# Patient Record
Sex: Male | Born: 1975
Health system: Southern US, Community
[De-identification: ages and names within clinical notes are randomized; demographics above are authoritative.]

## PROBLEM LIST (undated history)

## (undated) DIAGNOSIS — F419 Anxiety disorder, unspecified: Secondary | ICD-10-CM

## (undated) DIAGNOSIS — J45909 Unspecified asthma, uncomplicated: Secondary | ICD-10-CM

---

## 2011-12-11 ENCOUNTER — Emergency Department (HOSPITAL_BASED_OUTPATIENT_CLINIC_OR_DEPARTMENT_OTHER)
Admission: EM | Admit: 2011-12-11 | Discharge: 2011-12-11 | Disposition: A | Discharge status: Home / Self Care | Payer: No Typology Code available for payment source | Attending: Emergency Medicine | Admitting: Emergency Medicine

## 2011-12-11 ENCOUNTER — Emergency Department (HOSPITAL_BASED_OUTPATIENT_CLINIC_OR_DEPARTMENT_OTHER): Payer: No Typology Code available for payment source

## 2011-12-11 ENCOUNTER — Encounter (HOSPITAL_BASED_OUTPATIENT_CLINIC_OR_DEPARTMENT_OTHER): Payer: Self-pay | Admitting: *Deleted

## 2011-12-11 DIAGNOSIS — M25569 Pain in unspecified knee: Secondary | ICD-10-CM | POA: Insufficient documentation

## 2011-12-11 DIAGNOSIS — R079 Chest pain, unspecified: Secondary | ICD-10-CM | POA: Insufficient documentation

## 2011-12-11 DIAGNOSIS — M25519 Pain in unspecified shoulder: Secondary | ICD-10-CM | POA: Insufficient documentation

## 2011-12-11 DIAGNOSIS — T1490XA Injury, unspecified, initial encounter: Secondary | ICD-10-CM | POA: Insufficient documentation

## 2011-12-11 HISTORY — DX: Unspecified asthma, uncomplicated: J45.909

## 2011-12-11 MED ORDER — TRAMADOL HCL 50 MG PO TABS: 50.0000 mg | ORAL_TABLET | Freq: Three times a day (TID) | ORAL | Status: DC | PRN

## 2011-12-11 MED ORDER — TRAMADOL HCL 50 MG PO TABS
50.0000 mg | ORAL_TABLET | Freq: Once | ORAL | Status: AC
  Administered 2011-12-11: 50 mg via ORAL
  Filled 2011-12-11: qty 1
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
## 2012-10-24 ENCOUNTER — Emergency Department (HOSPITAL_BASED_OUTPATIENT_CLINIC_OR_DEPARTMENT_OTHER): Payer: BC Managed Care – PPO

## 2012-10-24 ENCOUNTER — Emergency Department (HOSPITAL_BASED_OUTPATIENT_CLINIC_OR_DEPARTMENT_OTHER)
Admission: EM | Admit: 2012-10-24 | Discharge: 2012-10-24 | Disposition: A | Discharge status: Home / Self Care | Payer: BC Managed Care – PPO | Attending: Emergency Medicine | Admitting: Emergency Medicine

## 2012-10-24 ENCOUNTER — Encounter (HOSPITAL_BASED_OUTPATIENT_CLINIC_OR_DEPARTMENT_OTHER): Payer: Self-pay | Admitting: Emergency Medicine

## 2012-10-24 DIAGNOSIS — Y929 Unspecified place or not applicable: Secondary | ICD-10-CM | POA: Insufficient documentation

## 2012-10-24 DIAGNOSIS — W208XXA Other cause of strike by thrown, projected or falling object, initial encounter: Secondary | ICD-10-CM | POA: Insufficient documentation

## 2012-10-24 DIAGNOSIS — S20212A Contusion of left front wall of thorax, initial encounter: Secondary | ICD-10-CM

## 2012-10-24 DIAGNOSIS — S20219A Contusion of unspecified front wall of thorax, initial encounter: Secondary | ICD-10-CM | POA: Insufficient documentation

## 2012-10-24 DIAGNOSIS — Y9389 Activity, other specified: Secondary | ICD-10-CM | POA: Insufficient documentation

## 2012-10-24 MED ORDER — IBUPROFEN 800 MG PO TABS: 800.0000 mg | ORAL_TABLET | Freq: Three times a day (TID) | ORAL | Status: DC

## 2012-10-24 MED ORDER — HYDROCODONE-ACETAMINOPHEN 5-325 MG PO TABS: 2.0000 | ORAL_TABLET | ORAL | Status: DC | PRN

## 2012-10-24 MED ORDER — HYDROCODONE-ACETAMINOPHEN 5-325 MG PO TABS
2.0000 | ORAL_TABLET | Freq: Once | ORAL | Status: AC
  Administered 2012-10-24: 2 via ORAL
  Filled 2012-10-24: qty 2

## 2012-10-24 NOTE — ED Notes (Addendum)
Pt c/o right sided chest wall pain after dropping a dumbbell on chest x 2 days ago

## 2012-10-24 NOTE — ED Notes (Signed)
Patient transported to X-ray 

## 2012-10-24 NOTE — ED Notes (Signed)
PA at bedside.

## 2012-10-24 NOTE — ED Provider Notes (Signed)
CSN: [id number]     Arrival date & time 10/24/12  1937 History   None    Chief Complaint  Patient presents with  . Chest Injury   (Consider location/radiation/quality/duration/timing/severity/associated sxs/prior Treatment) Patient is a 37 y.o. male presenting with chest pain. The history is provided by the patient. No language interpreter was used.  Chest Pain Pain location:  L chest Pain quality: aching   Pain radiates to:  Does not radiate Pain radiates to the back: no   Pain severity:  Moderate Onset quality:  Sudden Timing:  Constant Pt dropped a 50 pd weight on his chest.   Pt complains of swelling and pain to right chest  Past Medical History  Diagnosis Date  . Asthma    History reviewed. No pertinent past surgical history. No family history on file. History  Substance Use Topics  . Smoking status: Never Smoker   . Smokeless tobacco: Not on file  . Alcohol Use: No    Review of Systems  Cardiovascular: Positive for chest pain.  All other systems reviewed and are negative.    Allergies  Review of patient's allergies indicates no known allergies.  Home Medications   Current Outpatient Rx  Name  Route  Sig  Dispense  Refill  . cyclobenzaprine (FLEXERIL) 10 MG tablet   Oral   Take 10 mg by mouth at bedtime as needed for muscle spasms.         . traMADol (ULTRAM) 50 MG tablet   Oral   Take 1 tablet (50 mg total) by mouth every 8 (eight) hours as needed for pain.   10 tablet   0    BP 125/73  Pulse 115  Temp(Src) 98.4 F (36.9 C) (Oral)  Resp 16  Ht 5\' 11"  (1.803 m)  Wt 237 lb (107.502 kg)  BMI 33.07 kg/m2  SpO2 100% Physical Exam  Nursing note and vitals reviewed. Constitutional: He appears well-developed and well-nourished.  HENT:  Head: Normocephalic and atraumatic.  Cardiovascular: Normal rate.   Pulmonary/Chest: Effort normal. He exhibits tenderness.  Tender right chest,  Hematoma right upper chest wall.    Abdominal: Soft.   Musculoskeletal: Normal range of motion.  Neurological: He is alert.  Skin: Skin is warm.  Psychiatric: He has a normal mood and affect.    ED Course  Procedures (including critical care time) Labs Review Labs Reviewed - No data to display Imaging Review Dg Chest 2 View  10/24/2012   *RADIOLOGY REPORT*  Clinical Data: Chest wall injury 2 days ago  CHEST - 2 VIEW  Comparison: 12/11/2011  Findings: Cardiomediastinal silhouette is stable.  No acute infiltrate or pulmonary edema.  No gross fractures are identified. No diagnostic pneumothorax.  IMPRESSION: No active disease.  No diagnostic pneumothorax.   Original Report Authenticated By: Natasha Mead, M.D.    MDM   1. Hematoma, chest wall, left, initial encounter    No fracture on xray.   Pt counseled on hematoma.      Lonia Skinner [place], PA-C 10/24/12 2121

## 2012-10-25 NOTE — ED Provider Notes (Signed)
Medical screening examination/treatment/procedure(s) were performed by non-physician practitioner and as supervising physician I was immediately available for consultation/collaboration.   Audree Camel, MD 10/25/12 1144

## 2012-11-07 ENCOUNTER — Ambulatory Visit: Payer: BC Managed Care – PPO | Admitting: Family Medicine

## 2016-04-03 ENCOUNTER — Emergency Department (HOSPITAL_COMMUNITY)
Admission: EM | Admit: 2016-04-03 | Discharge: 2016-04-03 | Disposition: A | Discharge status: Home / Self Care | Payer: BLUE CROSS/BLUE SHIELD | Attending: Emergency Medicine | Admitting: Emergency Medicine

## 2016-04-03 ENCOUNTER — Encounter (HOSPITAL_COMMUNITY): Payer: Self-pay | Admitting: Emergency Medicine

## 2016-04-03 ENCOUNTER — Emergency Department (HOSPITAL_COMMUNITY): Payer: BLUE CROSS/BLUE SHIELD

## 2016-04-03 DIAGNOSIS — Z79899 Other long term (current) drug therapy: Secondary | ICD-10-CM | POA: Insufficient documentation

## 2016-04-03 DIAGNOSIS — R55 Syncope and collapse: Secondary | ICD-10-CM | POA: Diagnosis present

## 2016-04-03 DIAGNOSIS — F419 Anxiety disorder, unspecified: Secondary | ICD-10-CM | POA: Insufficient documentation

## 2016-04-03 DIAGNOSIS — J45909 Unspecified asthma, uncomplicated: Secondary | ICD-10-CM | POA: Insufficient documentation

## 2016-04-03 HISTORY — DX: Anxiety disorder, unspecified: F41.9

## 2016-04-03 LAB — BASIC METABOLIC PANEL
Anion gap: 7 (ref 5–15)
BUN: 14 mg/dL (ref 6–20)
CO2: 25 mmol/L (ref 22–32)
Calcium: 9.2 mg/dL (ref 8.9–10.3)
Chloride: 106 mmol/L (ref 101–111)
Creatinine, Ser: 1.07 mg/dL (ref 0.61–1.24)
GFR calc Af Amer: >60 mL/min (ref >60)
GFR calc non Af Amer: >60 mL/min (ref >60)
Glucose, Bld: 97 mg/dL (ref 65–99)
Potassium: 3.8 mmol/L (ref 3.5–5.1)
Sodium: 138 mmol/L (ref 135–145)

## 2016-04-03 MED ORDER — SODIUM CHLORIDE 0.9 % IV BOLUS (SEPSIS): 1000.0000 mL | Freq: Once | INTRAVENOUS | Status: DC

## 2016-04-03 NOTE — ED Notes (Signed)
Pt stated he did not want to wait and get IV fluids. PA aware.

## 2016-04-03 NOTE — Discharge Instructions (Signed)
Take ibuprofen and/or Tylenol as prescribed over-the-counter as needed for your back pain. Use ice and heat alternating 20 minutes on, 20 minutes off. Attempted to the stretches and exercises attached as tolerated. Please follow-up in establish care with a primary care provider by calling the number circled on your discharge paperwork. Please make your psychiatrist aware of today's episode. Please return to emergency department if you develop any new or worsening symptoms.

## 2016-04-03 NOTE — ED Notes (Signed)
Pt drinking PO fluids.

## 2016-04-03 NOTE — ED Provider Notes (Signed)
[facility]-EMERGENCY DEPT Provider Note   CSN: [id number] Arrival date & time: 04/03/16  1901  By signing my name below, I, [name], attest that this documentation has been prepared under the direction and in the presence of non-physician practitioner, Glenford Bayley, PA-C. Electronically Signed: [name], Scribe. 04/03/2016. 7:23 PM.  History   Chief Complaint Chief Complaint  Patient presents with  . Anxiety   The history is provided by the patient. No language interpreter was used.   HPI Comments: Vincent Glass is a 41 y.o. male with a PMHx of anxiety who presents to the Emergency Department complaining a syncopal episode that occurred today. He states he has had increased stress at work recently from [employer]. He reports he was getting stressed out at work, Became anxious, left him to compose himself, and he had LOC. He cannot recall duration of episode. He reports associated increased heart rate, anxiety, and intermittent LUE numbness with anxiety. 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 He is currently on Xanex for depression. He denies any SI or HI or AVH.  Past Medical History:  Diagnosis Date  . Anxiety   . Asthma     There are no active problems to display for this patient.   History reviewed. No pertinent surgical history.     Home Medications    Prior to Admission medications   Medication Sig Start Date End Date Taking? Authorizing Provider  cyclobenzaprine (FLEXERIL) 10 MG tablet Take 10 mg by mouth at bedtime as needed for muscle spasms.    Historical Provider, MD  HYDROcodone-acetaminophen (NORCO/VICODIN) 5-325 MG per tablet Take 2 tablets by mouth every 4 (four) hours as needed. 10/24/12   Elson Areas, PA-C  ibuprofen (ADVIL,MOTRIN) 800 MG tablet Take 1 tablet (800 mg total) by mouth 3 (three) times daily. 10/24/12   Elson Areas, PA-C  traMADol (ULTRAM) 50 MG tablet Take 1 tablet (50  mg total) by mouth every 8 (eight) hours as needed for pain. 12/11/11   Gerhard Munch, MD    Family History No family history on file.  Social History Social History  Substance Use Topics  . Smoking status: Never Smoker  . Smokeless tobacco: Not on file  . Alcohol use No     Allergies   Patient has no known allergies.   Review of Systems Review of Systems  Constitutional: Negative for chills and fever.  HENT: Negative for facial swelling and sore throat.   Respiratory: Negative for shortness of breath.   Cardiovascular: Positive for palpitations. Negative for chest pain.  Gastrointestinal: Negative for abdominal pain, nausea and vomiting.  Genitourinary: Negative for dysuria.  Musculoskeletal: Positive for back pain.  Skin: Negative for rash and wound.  Neurological: Positive for syncope and numbness (LUE). Negative for headaches.  Psychiatric/Behavioral: Negative for suicidal ideas. The patient is nervous/anxious.      Physical Exam Updated Vital Signs BP 125/85 (BP Location: Left Arm)   Pulse 91   Temp 98.7 F (37.1 C) (Oral)   Resp 18   SpO2 95%   Physical Exam  Constitutional: He appears well-developed and well-nourished. No distress.  HENT:  Head: Normocephalic and atraumatic.  Mouth/Throat: Oropharynx is clear and moist. No oropharyngeal exudate.  Eyes: Conjunctivae are normal. Pupils are equal, round, and reactive to light. Right eye exhibits no discharge. Left eye exhibits no discharge. No scleral icterus.  Neck: Normal range of motion. Neck supple.  No thyromegaly present.  Cardiovascular: Normal rate, regular rhythm, normal heart sounds and intact distal pulses.  Exam reveals no gallop and no friction rub.   No murmur heard. Pulmonary/Chest: Effort normal and breath sounds normal. No stridor. No respiratory distress. He has no wheezes. He has no rales.  Abdominal: Soft. Bowel sounds are normal. He exhibits no distension. There is no tenderness. There is  no rebound and no guarding.  Musculoskeletal: He exhibits no edema.       Back:  Midline thoracic and lumbar TTP. Chest: Palpable most likely xiphoid process.   Lymphadenopathy:    He has no cervical adenopathy.  Neurological: He is alert. Coordination normal.  CN 3-12 intact; normal sensation throughout; 5/5 strength in all 4 extremities; equal bilateral grip strength  Skin: Skin is warm and dry. No rash noted. He is not diaphoretic. No pallor.  Psychiatric: He has a normal mood and affect.  Nursing note and vitals reviewed.    ED Treatments / Results  DIAGNOSTIC STUDIES: Oxygen Saturation is 95% on RA, normal by my interpretation.    COORDINATION OF CARE: 7:27 PM- Pt advised of plan for treatment and pt agrees.  Labs (all labs ordered are listed, but only abnormal results are displayed) Labs Reviewed  BASIC METABOLIC PANEL    EKG  EKG Interpretation  Date/Time:  Saturday April 03 2016 19:59:31 EST Ventricular Rate:  93 PR Interval:    QRS Duration: 76 QT Interval:  326 QTC Calculation: 406 R Axis:   91 Text Interpretation:  Sinus rhythm Consider left atrial enlargement Borderline right axis deviation ST elevation diffusely, similar to 2013 Confirmed by GOLDSTON MD, SCOTT [phone]([id number]) on 04/04/2016 1:08:23 AM       Radiology Dg Chest 2 View  Result Date: 04/03/2016 CLINICAL DATA:  Recent syncopal episode EXAM: CHEST  2 VIEW COMPARISON:  10/24/2012 FINDINGS: The heart size and mediastinal contours are within normal limits. Both lungs are clear. The visualized skeletal structures are unremarkable. IMPRESSION: No active cardiopulmonary disease. Electronically Signed   By: Alcide CleverMark  Lukens M.D.   On: 04/03/2016 20:51   Dg Thoracic Spine 2 View  Result Date: 04/03/2016 CLINICAL DATA:  Back pain radiating into the left arm, no known injury, initial encounter EXAM: THORACIC SPINE 2 VIEWS COMPARISON:  None. FINDINGS: Vertebral body height is well maintained. Mild osteophytic changes  are seen. No compression deformities are noted. The visualize ribcage is within normal limits. No paraspinal mass lesion is seen. IMPRESSION: Mild degenerative change without acute abnormality. Electronically Signed   By: Alcide CleverMark  Lukens M.D.   On: 04/03/2016 20:51   Dg Lumbar Spine Complete  Result Date: 04/03/2016 CLINICAL DATA:  Low back pain radiating into the left leg, initial encounter EXAM: LUMBAR SPINE - COMPLETE 4+ VIEW COMPARISON:  None. FINDINGS: Five lumbar type vertebral bodies are well visualized. Vertebral body height is well maintained. No pars defects are seen. Mild osteophytic changes are noted with disc space narrowing at L4-5. No anterolisthesis is seen. No soft tissue abnormality is noted. IMPRESSION: Mild degenerative changes of the lumbar spine Electronically Signed   By: Alcide CleverMark  Lukens M.D.   On: 04/03/2016 20:50    Procedures Procedures (including critical care time)  Medications Ordered in ED Medications - No data to display   Initial Impression / Assessment and Plan / ED Course  I have reviewed the triage vital signs and the nursing notes.  Pertinent labs & imaging results that were available during my care of the patient were reviewed  by me and considered in my medical decision making (see chart for details).     BMP unremarkable. Thoracic and lumbar x-ray show mild degenerative changes without acute abnormality. CXR shows no active cardiopulmonary disease. EKG shows no changes since last tracing. Patient with mild orthostatic hypotension. We recommended a liter of IV fluids, however patient symptoms improved in the ED and he declined and would like to only orally hydrate. This is reasonable considering patient was not symptomatic during orthostatic vitals. Patient to follow up and establish care with PCP for further evaluation of his chronic symptoms, as well as make his psychiatrist aware of today's episode. Return precautions discussed. Patient understands and agrees  with plan. Patient discharged in satisfactory condition. Discussed case with Dr. Ranae Palms who guided the patient's management and agrees with plan.  Final Clinical Impressions(s) / ED Diagnoses   Final diagnoses:  Anxiety  Syncope, unspecified syncope type    New Prescriptions Discharge Medication List as of 04/03/2016  9:45 PM     I personally performed the services described in this documentation, which was scribed in my presence. The recorded information has been reviewed and is accurate.     Emi Holes, PA-C 04/04/16 0111    Loren Racer, MD 04/07/16 [phone]

## 2016-04-03 NOTE — ED Triage Notes (Signed)
Per EMS-states he had a syncopal episode at work-possibly related to anxiety-appears to be under a lot of stress at work-states he sat down at work and "blacked out"-couldn't remember anything

## 2017-06-20 ENCOUNTER — Encounter (HOSPITAL_COMMUNITY): Payer: Self-pay | Admitting: Emergency Medicine

## 2017-06-20 ENCOUNTER — Emergency Department (HOSPITAL_COMMUNITY)
Admission: EM | Admit: 2017-06-20 | Discharge: 2017-06-20 | Disposition: A | Discharge status: Home / Self Care | Payer: BLUE CROSS/BLUE SHIELD | Attending: Emergency Medicine | Admitting: Emergency Medicine

## 2017-06-20 DIAGNOSIS — M545 Low back pain: Secondary | ICD-10-CM | POA: Diagnosis present

## 2017-06-20 DIAGNOSIS — Z5321 Procedure and treatment not carried out due to patient leaving prior to being seen by health care provider: Secondary | ICD-10-CM | POA: Insufficient documentation

## 2017-06-20 NOTE — ED Notes (Addendum)
1x call back for no answer

## 2017-06-20 NOTE — ED Notes (Signed)
3x call back no answer 

## 2017-06-20 NOTE — ED Notes (Addendum)
2x call back no answer

## 2017-06-20 NOTE — ED Triage Notes (Addendum)
Pt reports that he had mid to lower back pains for couple weeks. [facility] issues with abd pain and blood in urine and not his back pain. Reports that his mother has MS, has no PCP set up in [place]. Patient ambulatory with steady gait.

## 2017-06-21 ENCOUNTER — Encounter (HOSPITAL_BASED_OUTPATIENT_CLINIC_OR_DEPARTMENT_OTHER): Payer: Self-pay | Admitting: Emergency Medicine

## 2017-06-21 ENCOUNTER — Other Ambulatory Visit: Payer: Self-pay

## 2017-06-21 ENCOUNTER — Emergency Department (HOSPITAL_BASED_OUTPATIENT_CLINIC_OR_DEPARTMENT_OTHER)
Admission: EM | Admit: 2017-06-21 | Discharge: 2017-06-21 | Disposition: A | Discharge status: Home / Self Care | Payer: BLUE CROSS/BLUE SHIELD | Attending: Emergency Medicine | Admitting: Emergency Medicine

## 2017-06-21 ENCOUNTER — Emergency Department (HOSPITAL_BASED_OUTPATIENT_CLINIC_OR_DEPARTMENT_OTHER): Payer: BLUE CROSS/BLUE SHIELD

## 2017-06-21 DIAGNOSIS — S39012A Strain of muscle, fascia and tendon of lower back, initial encounter: Secondary | ICD-10-CM | POA: Diagnosis not present

## 2017-06-21 DIAGNOSIS — X500XXA Overexertion from strenuous movement or load, initial encounter: Secondary | ICD-10-CM | POA: Diagnosis not present

## 2017-06-21 DIAGNOSIS — F17210 Nicotine dependence, cigarettes, uncomplicated: Secondary | ICD-10-CM | POA: Insufficient documentation

## 2017-06-21 DIAGNOSIS — Y990 Civilian activity done for income or pay: Secondary | ICD-10-CM | POA: Diagnosis not present

## 2017-06-21 DIAGNOSIS — Z79899 Other long term (current) drug therapy: Secondary | ICD-10-CM | POA: Diagnosis not present

## 2017-06-21 DIAGNOSIS — S3992XA Unspecified injury of lower back, initial encounter: Secondary | ICD-10-CM | POA: Diagnosis present

## 2017-06-21 DIAGNOSIS — J45909 Unspecified asthma, uncomplicated: Secondary | ICD-10-CM | POA: Diagnosis not present

## 2017-06-21 DIAGNOSIS — R109 Unspecified abdominal pain: Secondary | ICD-10-CM | POA: Diagnosis not present

## 2017-06-21 DIAGNOSIS — Y929 Unspecified place or not applicable: Secondary | ICD-10-CM | POA: Insufficient documentation

## 2017-06-21 DIAGNOSIS — Y939 Activity, unspecified: Secondary | ICD-10-CM | POA: Diagnosis not present

## 2017-06-21 LAB — URINALYSIS, ROUTINE W REFLEX MICROSCOPIC
BILIRUBIN URINE: NEGATIVE
GLUCOSE, UA: NEGATIVE mg/dL
KETONES UR: NEGATIVE mg/dL
Leukocytes, UA: NEGATIVE
Nitrite: NEGATIVE
PH: 6.5 (ref 5.0–8.0)
Protein, ur: NEGATIVE mg/dL
Specific Gravity, Urine: 1.02 (ref 1.005–1.030)

## 2017-06-21 LAB — URINALYSIS, MICROSCOPIC (REFLEX): WBC, UA: NONE SEEN WBC/hpf (ref 0–5)

## 2017-06-21 MED ORDER — LIDOCAINE 5 % EX PTCH: 1.0000 | MEDICATED_PATCH | CUTANEOUS | 0 refills | Status: DC

## 2017-06-21 MED ORDER — METHOCARBAMOL 500 MG PO TABS: 500.0000 mg | ORAL_TABLET | Freq: Two times a day (BID) | ORAL | 0 refills | Status: DC

## 2017-06-21 MED ORDER — NAPROXEN 500 MG PO TABS: 500.0000 mg | ORAL_TABLET | Freq: Two times a day (BID) | ORAL | 0 refills | Status: DC

## 2017-06-21 MED ORDER — KETOROLAC TROMETHAMINE 30 MG/ML IJ SOLN
30.0000 mg | Freq: Once | INTRAMUSCULAR | Status: AC
  Administered 2017-06-21: 30 mg via INTRAMUSCULAR
  Filled 2017-06-21: qty 1

## 2017-06-21 MED FILL — NAPROXEN 500 MG TABLET: 500 | 15 days supply | Qty: 30 | Fill #0

## 2017-06-21 MED FILL — METHOCARBAMOL 500 MG TABLET: 500 | 10 days supply | Qty: 20 | Fill #0

## 2017-06-21 NOTE — ED Triage Notes (Signed)
Mid to lower back pain for a couple of weeks. [facility]. Denies injury.

## 2017-06-21 NOTE — ED Provider Notes (Signed)
[facility] EMERGENCY DEPARTMENT Provider Note   CSN: [id number] Arrival date & time: 06/21/17  1513     History   Chief Complaint Chief Complaint  Patient presents with  . Back Pain    HPI Vincent Glass is a 42 y.o. male with a past medical history of asthma, who presents to ED for evaluation of mid to lower back pain for the past 3 weeks.  He states that the pain is mostly on the right side of his back and sometimes radiates to his right flank.  He was seen at urgent care last week and was given antibiotics for possible UTI/abnormalities in liver and kidneys.  They did inform that he had microscopic hematuria.  Patient is unsure what his exact diagnosis was or what medications he was given.  However, he states that he has only been taking ibuprofen with no improvement in his back pain.  He works as a Production designer, theatre/television/film in [employer] and will sometimes do heavy lifting but states that "I do not ever use my back to left."  He denies any injuries or falls.  Denies any prior back surgeries, history of cancer, history of IV drug use, fevers, gross hematuria, numbness in legs, loss of bowel or bladder function.  HPI  Past Medical History:  Diagnosis Date  . Anxiety   . Asthma     There are no active problems to display for this patient.   History reviewed. No pertinent surgical history.      Home Medications    Prior to Admission medications   Medication Sig Start Date End Date Taking? Authorizing Provider  cyclobenzaprine (FLEXERIL) 10 MG tablet Take 10 mg by mouth at bedtime as needed for muscle spasms.    [provider]  HYDROcodone-acetaminophen (NORCO/VICODIN) 5-325 MG per tablet Take 2 tablets by mouth every 4 (four) hours as needed. 10/24/12   Elson Areas, PA-C  ibuprofen (ADVIL,MOTRIN) 800 MG tablet Take 1 tablet (800 mg total) by mouth 3 (three) times daily. 10/24/12   Elson Areas, PA-C  lidocaine (LIDODERM) 5 % Place 1 patch onto the skin daily. Remove &  Discard patch within 12 hours or as directed by MD 06/21/17   Dietrich Pates, PA-C  methocarbamol (ROBAXIN) 500 MG tablet Take 1 tablet (500 mg total) by mouth 2 (two) times daily. 06/21/17   Jama Mcmiller, PA-C  naproxen (NAPROSYN) 500 MG tablet Take 1 tablet (500 mg total) by mouth 2 (two) times daily. 06/21/17   Quenten Nawaz, PA-C  traMADol (ULTRAM) 50 MG tablet Take 1 tablet (50 mg total) by mouth every 8 (eight) hours as needed for pain. 12/11/11   Gerhard Munch, MD    Family History No family history on file.  Social History Social History   Tobacco Use  . Smoking status: Current Every Day Smoker    Types: Cigarettes, Cigars  . Smokeless tobacco: Never Used  Substance Use Topics  . Alcohol use: Yes  . Drug use: No     Allergies   Patient has no known allergies.   Review of Systems Review of Systems  Constitutional: Negative for appetite change, chills and fever.  HENT: Negative for ear pain, rhinorrhea, sneezing and sore throat.   Eyes: Negative for photophobia and visual disturbance.  Respiratory: Negative for cough, chest tightness, shortness of breath and wheezing.   Cardiovascular: Negative for chest pain and palpitations.  Gastrointestinal: Negative for abdominal pain, blood in stool, constipation, diarrhea, nausea and vomiting.  Genitourinary: Positive  for flank pain and hematuria. Negative for dysuria and urgency.  Musculoskeletal: Positive for back pain. Negative for myalgias.  Skin: Negative for rash.  Neurological: Negative for dizziness, weakness and light-headedness.     Physical Exam Updated Vital Signs BP (!) 136/96 (BP Location: Left Arm)   Pulse 94   Temp 98.3 F (36.8 C) (Oral)   Resp 16   Ht 5\' 11"  (1.803 m)   Wt 103.4 kg (228 lb)   SpO2 98%   BMI 31.80 kg/m   Physical Exam  Constitutional: He appears well-developed and well-nourished. No distress.  Nontoxic appearing and in no acute distress.  HENT:  Head: Normocephalic and atraumatic.    Nose: Nose normal.  Eyes: Conjunctivae and EOM are normal. Right eye exhibits no discharge. Left eye exhibits no discharge. No scleral icterus.  Neck: Normal range of motion. Neck supple.  Cardiovascular: Normal rate, regular rhythm, normal heart sounds and intact distal pulses. Exam reveals no gallop and no friction rub.  No murmur heard. Pulmonary/Chest: Effort normal and breath sounds normal. No respiratory distress.  Abdominal: Soft. Bowel sounds are normal. He exhibits no distension. There is no tenderness. There is no guarding.  Musculoskeletal: Normal range of motion. He exhibits tenderness. He exhibits no edema.       Back:  No midline spinal tenderness present in lumbar, thoracic or cervical spine. No step-off palpated. No visible bruising, edema or temperature change noted. No objective signs of numbness present. No saddle anesthesia. 2+ DP pulses bilaterally. Sensation intact to light touch. Strength 5/5 in bilateral lower extremities.  Neurological: He is alert. He exhibits normal muscle tone. Coordination normal.  Skin: Skin is warm and dry. No rash noted.  Psychiatric: He has a normal mood and affect.  Nursing note and vitals reviewed.    ED Treatments / Results  Labs (all labs ordered are listed, but only abnormal results are displayed) Labs Reviewed  URINALYSIS, ROUTINE W REFLEX MICROSCOPIC - Abnormal; Notable for the following components:      Result Value   Hgb urine dipstick MODERATE (*)    All other components within normal limits  URINALYSIS, MICROSCOPIC (REFLEX) - Abnormal; Notable for the following components:   Bacteria, UA RARE (*)    All other components within normal limits    EKG None  Radiology Ct Renal Stone Study  Result Date: 06/21/2017 CLINICAL DATA:  Back pain between scapula radiating into right flank for 2 weeks. Trace blood in urine. EXAM: CT ABDOMEN AND PELVIS WITHOUT CONTRAST TECHNIQUE: Multidetector CT imaging of the abdomen and pelvis  was performed following the standard protocol without IV contrast. COMPARISON:  None. FINDINGS: Lower chest: A small nodule along the left major fissures consistent with an intra fissural lymph node of no significance. The lung bases are otherwise normal. Hepatobiliary: No focal liver abnormality is seen. No gallstones, gallbladder wall thickening, or biliary dilatation. Pancreas: Unremarkable. No pancreatic ductal dilatation or surrounding inflammatory changes. Spleen: Normal in size without focal abnormality. Adrenals/Urinary Tract: Adrenal glands are unremarkable. Kidneys are normal, without renal calculi, focal lesion, or hydronephrosis. Bladder is unremarkable. Stomach/Bowel: Stomach is within normal limits. Appendix appears normal. No evidence of bowel wall thickening, distention, or inflammatory changes. Vascular/Lymphatic: Minimal atherosclerotic change in the nonaneurysmal aorta. No adenopathy. Reproductive: Prostate is unremarkable. Other: No abdominal wall hernia or abnormality. No abdominopelvic ascites. Musculoskeletal: No acute or significant osseous findings. IMPRESSION: 1. No renal stones or obstruction. No cause for the patient's symptoms identified. 2. Minimal atherosclerotic change in  the nonaneurysmal aorta. Electronically Signed   By: Gerome Samavid  Williams III M.D   On: 06/21/2017 16:39    Procedures Procedures (including critical care time)  Medications Ordered in ED Medications  ketorolac (TORADOL) 30 MG/ML injection 30 mg (has no administration in time range)     Initial Impression / Assessment and Plan / ED Course  I have reviewed the triage vital signs and the nursing notes.  Pertinent labs & imaging results that were available during my care of the patient were reviewed by me and considered in my medical decision making (see chart for details).     Patient presents to ED for evaluation of mid to lower back pain for the past 2 weeks.  He states sometimes pain is on the right  side of his back and radiates to his right flank.  He was seen in urgent care last week and was told he has microscopic hematuria.  He was given antibiotics but is unsure of what the exact medication diagnosis was.  He has been taking ibuprofen with no improvement in his back pain.  Denies any injuries or falls, prior back surgeries, history of cancer, history of IV drug use, fevers, numbness in legs, loss of bowel or bladder function.  On physical exam he is overall well-appearing.  He has no deficits on his neurological examination.  He does have right-sided flank and right mid back tenderness to palpation.  He is afebrile.  Urinalysis did show hematuria.  CT renal stone study with no acute or concerning findings.  Suspect that his symptoms are musculoskeletal in nature.  Doubt cauda equina, dissection or other emergent cause of symptoms.  Will give anti-inflammatories, lidocaine patch and muscle relaxer to help with symptoms.  Encouraged heat and stretching as well.  Advised to return for any severe worsening symptoms.  Portions of this note were generated with Scientist, clinical (histocompatibility and immunogenetics)Dragon dictation software. Dictation errors may occur despite best attempts at proofreading.  Final Clinical Impressions(s) / ED Diagnoses   Final diagnoses:  Strain of lumbar region, initial encounter    ED Discharge Orders        Ordered    lidocaine (LIDODERM) 5 %  Every 24 hours     06/21/17 1648    methocarbamol (ROBAXIN) 500 MG tablet  2 times daily     06/21/17 1648    naproxen (NAPROSYN) 500 MG tablet  2 times daily     06/21/17 1648       Dietrich PatesKhatri, Amelya Mabry, PA-C 06/21/17 1655    Tegeler, Canary Brimhristopher J, MD 06/21/17 [phone]

## 2020-01-18 ENCOUNTER — Emergency Department (HOSPITAL_COMMUNITY)
Admission: EM | Admit: 2020-01-18 | Discharge: 2020-01-19 | Disposition: A | Discharge status: Home / Self Care | Payer: BLUE CROSS/BLUE SHIELD | Attending: Emergency Medicine | Admitting: Emergency Medicine

## 2020-01-18 ENCOUNTER — Other Ambulatory Visit: Payer: Self-pay

## 2020-01-18 ENCOUNTER — Emergency Department (HOSPITAL_COMMUNITY): Payer: BLUE CROSS/BLUE SHIELD

## 2020-01-18 DIAGNOSIS — M25512 Pain in left shoulder: Secondary | ICD-10-CM | POA: Diagnosis not present

## 2020-01-18 DIAGNOSIS — R079 Chest pain, unspecified: Secondary | ICD-10-CM | POA: Diagnosis not present

## 2020-01-18 DIAGNOSIS — M542 Cervicalgia: Secondary | ICD-10-CM | POA: Diagnosis not present

## 2020-01-18 DIAGNOSIS — F10920 Alcohol use, unspecified with intoxication, uncomplicated: Secondary | ICD-10-CM

## 2020-01-18 DIAGNOSIS — F17210 Nicotine dependence, cigarettes, uncomplicated: Secondary | ICD-10-CM | POA: Diagnosis not present

## 2020-01-18 DIAGNOSIS — J45909 Unspecified asthma, uncomplicated: Secondary | ICD-10-CM | POA: Insufficient documentation

## 2020-01-18 DIAGNOSIS — T1490XA Injury, unspecified, initial encounter: Secondary | ICD-10-CM

## 2020-01-18 DIAGNOSIS — R519 Headache, unspecified: Secondary | ICD-10-CM | POA: Diagnosis present

## 2020-01-18 DIAGNOSIS — F17290 Nicotine dependence, other tobacco product, uncomplicated: Secondary | ICD-10-CM | POA: Insufficient documentation

## 2020-01-18 DIAGNOSIS — R55 Syncope and collapse: Secondary | ICD-10-CM | POA: Insufficient documentation

## 2020-01-18 DIAGNOSIS — Z23 Encounter for immunization: Secondary | ICD-10-CM | POA: Diagnosis not present

## 2020-01-18 LAB — CBC
HCT: 51.1 % (ref 39.0–52.0)
Hemoglobin: 17 g/dL (ref 13.0–17.0)
MCH: 31.4 pg (ref 26.0–34.0)
MCHC: 33.3 g/dL (ref 30.0–36.0)
MCV: 94.5 fL (ref 80.0–100.0)
Platelets: 222 10*3/uL (ref 150–400)
RBC: 5.41 MIL/uL (ref 4.22–5.81)
RDW: 12.4 % (ref 11.5–15.5)
WBC: 7.2 10*3/uL (ref 4.0–10.5)
nRBC: 0 % (ref 0.0–0.2)

## 2020-01-18 LAB — BASIC METABOLIC PANEL
Anion gap: 10 (ref 5–15)
BUN: 8 mg/dL (ref 6–20)
CO2: 26 mmol/L (ref 22–32)
Calcium: 9.4 mg/dL (ref 8.9–10.3)
Chloride: 105 mmol/L (ref 98–111)
Creatinine, Ser: 1.02 mg/dL (ref 0.61–1.24)
GFR, Estimated: >60 mL/min (ref >60)
Glucose, Bld: 108 mg/dL — ABNORMAL HIGH (ref 70–99)
Potassium: 4.1 mmol/L (ref 3.5–5.1)
Sodium: 141 mmol/L (ref 135–145)

## 2020-01-18 LAB — RAPID URINE DRUG SCREEN, HOSP PERFORMED
Amphetamines: NOT DETECTED
Barbiturates: NOT DETECTED
Benzodiazepines: POSITIVE — AB
Cocaine: NOT DETECTED
Opiates: NOT DETECTED
Tetrahydrocannabinol: NOT DETECTED

## 2020-01-18 LAB — ETHANOL: Alcohol, Ethyl (B): 229 mg/dL — ABNORMAL HIGH (ref <10)
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
## 2020-01-19 ENCOUNTER — Emergency Department (HOSPITAL_COMMUNITY): Payer: BLUE CROSS/BLUE SHIELD

## 2020-01-19 MED ORDER — IOHEXOL 300 MG/ML  SOLN
100.0000 mL | Freq: Once | INTRAMUSCULAR | Status: AC | PRN
  Administered 2020-01-19: 100 mL via INTRAVENOUS

## 2020-01-19 MED ORDER — TETANUS-DIPHTH-ACELL PERTUSSIS 5-2.5-18.5 LF-MCG/0.5 IM SUSY
0.5000 mL | PREFILLED_SYRINGE | Freq: Once | INTRAMUSCULAR | Status: AC
  Administered 2020-01-19: 0.5 mL via INTRAMUSCULAR
  Filled 2020-01-19: qty 0.5

## 2020-01-19 MED ORDER — ONDANSETRON HCL 4 MG/2ML IJ SOLN
4.0000 mg | Freq: Once | INTRAMUSCULAR | Status: AC
  Administered 2020-01-19: 4 mg via INTRAVENOUS
  Filled 2020-01-19: qty 2

## 2020-01-19 MED ORDER — FENTANYL CITRATE (PF) 100 MCG/2ML IJ SOLN
50.0000 ug | Freq: Once | INTRAMUSCULAR | Status: AC
  Administered 2020-01-19: 50 ug via INTRAVENOUS
  Filled 2020-01-19: qty 2

## 2020-01-19 MED ORDER — SODIUM CHLORIDE 0.9 % IV BOLUS (SEPSIS)
1000.0000 mL | Freq: Once | INTRAVENOUS | Status: AC
  Administered 2020-01-19: 1000 mL via INTRAVENOUS
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
## 2020-01-19 NOTE — Discharge Instructions (Signed)
You may alternate Tylenol 1000 mg every 6 hours as needed for pain and Ibuprofen 800 mg every 8 hours as needed for pain.  Please take Ibuprofen with food.  Do not take more than 4000 mg of Tylenol (acetaminophen) in a 24 hour period.  Your imaging today showed no acute injury.  You will likely be more sore over the next several days and you are today.  I recommend increased rest, fluid intake.  You may use ice to areas that are painful, swollen or bruised.

## 2020-04-08 ENCOUNTER — Emergency Department (HOSPITAL_BASED_OUTPATIENT_CLINIC_OR_DEPARTMENT_OTHER)
Admission: EM | Admit: 2020-04-08 | Discharge: 2020-04-08 | Disposition: A | Discharge status: Home / Self Care | Payer: 59 | Attending: Emergency Medicine | Admitting: Emergency Medicine

## 2020-04-08 ENCOUNTER — Other Ambulatory Visit: Payer: Self-pay

## 2020-04-08 ENCOUNTER — Encounter (HOSPITAL_BASED_OUTPATIENT_CLINIC_OR_DEPARTMENT_OTHER): Payer: Self-pay | Admitting: Emergency Medicine

## 2020-04-08 DIAGNOSIS — S61213A Laceration without foreign body of left middle finger without damage to nail, initial encounter: Secondary | ICD-10-CM | POA: Insufficient documentation

## 2020-04-08 DIAGNOSIS — W274XXA Contact with kitchen utensil, initial encounter: Secondary | ICD-10-CM | POA: Insufficient documentation

## 2020-04-08 DIAGNOSIS — S61211A Laceration without foreign body of left index finger without damage to nail, initial encounter: Secondary | ICD-10-CM | POA: Diagnosis not present

## 2020-04-08 DIAGNOSIS — J45909 Unspecified asthma, uncomplicated: Secondary | ICD-10-CM | POA: Insufficient documentation

## 2020-04-08 DIAGNOSIS — S6992XA Unspecified injury of left wrist, hand and finger(s), initial encounter: Secondary | ICD-10-CM | POA: Diagnosis present

## 2020-04-08 DIAGNOSIS — F17210 Nicotine dependence, cigarettes, uncomplicated: Secondary | ICD-10-CM | POA: Insufficient documentation

## 2020-04-08 MED ORDER — LIDOCAINE HCL (PF) 1 % IJ SOLN
5.0000 mL | Freq: Once | INTRAMUSCULAR | Status: DC
  Filled 2020-04-08: qty 5

## 2020-04-08 NOTE — ED Triage Notes (Signed)
Pt c/o lac to left 1st and 2 nd finger x 1 hr ago. Pt states that he was helping cook dinner and cut himself. Bleeding controlled in triage. Pt aaox3, ambulatory with steady gait, VSS, GCS 15.

## 2020-04-08 NOTE — ED Provider Notes (Signed)
[facility] EMERGENCY DEPARTMENT Provider Note   CSN: [id number] Arrival date & time: 04/08/20  2119     History Chief Complaint  Patient presents with  . Laceration    Left 1st and 2nd digit    Emil Weigold is a 45 y.o. male who presents emergency department chief complaint of laceration.  Patient states he was chopping food in the kitchen tonight when he caught his index and middle finger on the left hand.  He is right-hand dominant.  Bleeding is controlled.  He is up-to-date on tetanus vaccination.  He denies any numbness or tingling.  HPI     Past Medical History:  Diagnosis Date  . Anxiety   . Asthma     There are no problems to display for this patient.   History reviewed. No pertinent surgical history.     History reviewed. No pertinent family history.  Social History   Tobacco Use  . Smoking status: Current Every Day Smoker    Types: Cigarettes, Cigars  . Smokeless tobacco: Never Used  Vaping Use  . Vaping Use: Never used  Substance Use Topics  . Alcohol use: Yes  . Drug use: No    Home Medications Prior to Admission medications   Medication Sig Start Date End Date Taking? Authorizing Provider  albuterol (VENTOLIN HFA) 108 (90 Base) MCG/ACT inhaler Inhale 2 puffs into the lungs every 6 (six) hours as needed for wheezing or shortness of breath.    [provider]  alprazolam Prudy Feeler) 2 MG tablet Take 1-2 mg by mouth See admin instructions. 2 mg daily, and 1 mg in the late evening    [provider]  Multiple Vitamins-Minerals (MULTIVITAMIN WITH MINERALS) tablet Take 1 tablet by mouth daily.    [provider]    Allergies    Percocet [oxycodone-acetaminophen]  Review of Systems   Review of Systems Ten systems reviewed and are negative for acute change, except as noted in the HPI.   Physical Exam Updated Vital Signs BP (!) 124/99 (BP Location: Right Arm)   Pulse (!) 103   Temp 98.2 F (36.8 C) (Oral)   Resp  19   Ht 5\' 11"  (1.803 m)   Wt 117.9 kg   SpO2 96%   BMI 36.26 kg/m   Physical Exam Vitals and nursing note reviewed.  Constitutional:      General: He is not in acute distress.    Appearance: He is well-developed and well-nourished. He is not diaphoretic.  HENT:     Head: Normocephalic and atraumatic.  Eyes:     General: No scleral icterus.    Conjunctiva/sclera: Conjunctivae normal.  Cardiovascular:     Rate and Rhythm: Normal rate and regular rhythm.     Heart sounds: Normal heart sounds.  Pulmonary:     Effort: Pulmonary effort is normal. No respiratory distress.     Breath sounds: Normal breath sounds.  Abdominal:     Palpations: Abdomen is soft.     Tenderness: There is no abdominal tenderness.  Musculoskeletal:        General: No edema.     Cervical back: Normal range of motion and neck supple.     Comments: 1 cm laceration to the left middle finger which is superficial and does not involve the nailbed.  There is a 1-1/2 cm laceration of the index finger of the left hand which is slightly deeper and over the flexural surface of the DIP, no nailbed involvement  Skin:  General: Skin is warm and dry.  Neurological:     Mental Status: He is alert.  Psychiatric:        Behavior: Behavior normal.     ED Results / Procedures / Treatments   Labs (all labs ordered are listed, but only abnormal results are displayed) Labs Reviewed - No data to display  EKG None  Radiology No results found.  Procedures .Marland KitchenLaceration Repair  Date/Time: 04/08/2020 11:44 PM Performed by: Arthor Captain, PA-C Authorized by: Arthor Captain, PA-C   Consent:    Consent obtained:  Verbal   Consent given by:  Patient   Risks discussed:  Infection, need for additional repair, pain, poor cosmetic result and poor wound healing   Alternatives discussed:  No treatment and delayed treatment Universal protocol:    Procedure explained and questions answered to patient or proxy's  satisfaction: yes     Relevant documents present and verified: yes     Test results available: yes     Imaging studies available: yes     Required blood products, implants, devices, and special equipment available: yes     Site/side marked: yes     Immediately prior to procedure, a time out was called: yes     Patient identity confirmed:  Verbally with patient Anesthesia:    Anesthesia method:  Local infiltration   Local anesthetic:  Lidocaine 1% w/o epi Laceration details:    Location:  Finger   Finger location:  L index finger   Length (cm):  1.5   Depth (mm):  3 Pre-procedure details:    Preparation:  Patient was prepped and draped in usual sterile fashion Treatment:    Area cleansed with:  Povidone-iodine   Amount of cleaning:  Standard   Irrigation solution:  Sterile saline   Irrigation method:  Syringe Skin repair:    Repair method:  Sutures   Suture size:  4-0   Wound skin closure material used: vicryl rapide.   Suture technique:  Running locked   Number of sutures:  5 Approximation:    Approximation:  Close Repair type:    Repair type:  Simple Post-procedure details:    Dressing:  Adhesive bandage   Procedure completion:  Tolerated well, no immediate complications .Marland KitchenLaceration Repair  Date/Time: 04/08/2020 11:45 PM Performed by: Arthor Captain, PA-C Authorized by: Arthor Captain, PA-C   Consent:    Consent obtained:  Verbal   Consent given by:  Patient   Risks discussed:  Infection, need for additional repair, pain, poor cosmetic result and poor wound healing   Alternatives discussed:  No treatment and delayed treatment Universal protocol:    Procedure explained and questions answered to patient or proxy's satisfaction: yes     Relevant documents present and verified: yes     Test results available: yes     Imaging studies available: yes     Required blood products, implants, devices, and special equipment available: yes     Site/side marked: yes      Immediately prior to procedure, a time out was called: yes     Patient identity confirmed:  Verbally with patient Anesthesia:    Anesthesia method:  None Laceration details:    Location:  Finger   Finger location:  L long finger   Length (cm):  1   Depth (mm):  1 Pre-procedure details:    Preparation:  Patient was prepped and draped in usual sterile fashion Exploration:    Wound exploration: wound explored through full range of  motion and entire depth of wound visualized   Treatment:    Area cleansed with:  Povidone-iodine   Amount of cleaning:  Standard   Irrigation solution:  Sterile saline Skin repair:    Repair method:  Tissue adhesive Approximation:    Approximation:  Close Repair type:    Repair type:  Simple Post-procedure details:    Dressing:  Open (no dressing)   Procedure completion:  Tolerated well, no immediate complications     Medications Ordered in ED Medications  lidocaine (PF) (XYLOCAINE) 1 % injection 5 mL (has no administration in time range)    ED Course  I have reviewed the triage vital signs and the nursing notes.  Pertinent labs & imaging results that were available during my care of the patient were reviewed by me and considered in my medical decision making (see chart for details).    MDM Rules/Calculators/A&P                          Patient with superficial lacerations of the 2nd and 3rd digits of the left hand.  No evidence of tendon involvement.  Patient will be discharged home.  Discussed home care and return precautions. Final Clinical Impression(s) / ED Diagnoses Final diagnoses:  Laceration of left index finger without foreign body without damage to nail, initial encounter  Laceration of left middle finger without foreign body without damage to nail, initial encounter    Rx / DC Orders ED Discharge Orders    None       Arthor Captain, PA-C 04/08/20 2347    Cathren Laine, MD 04/10/20 1517

## 2020-04-08 NOTE — Discharge Instructions (Addendum)
WOUND CARE (for the wound with stiches only- please follow the tissue adhesive would care instructions for your middle finger)  Keep area clean and dry for 24 hours. Do not remove bandage, if applied.  After 24 hours, remove bandage and wash wound gently with mild soap and warm water. Reapply a new bandage after cleaning wound, if directed.  Continue daily cleansing with soap and water until stitches/staples are removed.  Do not apply any ointments or creams to the wound while stitches/staples are in place, as this may cause delayed healing.  Seek medical careif you experience any of the following signs of infection: Swelling, redness, pus drainage, streaking, fever >101.0 F  Seek care if you experience excessive bleeding that does not stop after 15-20 minutes of constant, firm pressure. Sutures should dissolve in 1 week

## 2020-08-11 ENCOUNTER — Ambulatory Visit: Payer: 59 | Admitting: Family Medicine

## 2020-09-17 ENCOUNTER — Encounter: Payer: Self-pay | Admitting: Family Medicine

## 2020-09-17 ENCOUNTER — Other Ambulatory Visit: Payer: Self-pay

## 2020-09-17 ENCOUNTER — Ambulatory Visit (INDEPENDENT_AMBULATORY_CARE_PROVIDER_SITE_OTHER): Payer: 59 | Admitting: Family Medicine

## 2020-09-17 VITALS — BP 134/80 | HR 83 | Temp 97.4°F | Ht 70.0 in | Wt 237.4 lb

## 2020-09-17 DIAGNOSIS — Z0000 Encounter for general adult medical examination without abnormal findings: Secondary | ICD-10-CM | POA: Diagnosis not present

## 2020-09-17 LAB — COMPREHENSIVE METABOLIC PANEL
ALT: 41 U/L (ref 0–53)
AST: 27 U/L (ref 0–37)
Albumin: 4.5 g/dL (ref 3.5–5.2)
Alkaline Phosphatase: 88 U/L (ref 39–117)
BUN: 11 mg/dL (ref 6–23)
CO2: 26 mEq/L (ref 19–32)
Calcium: 9.4 mg/dL (ref 8.4–10.5)
Chloride: 103 mEq/L (ref 96–112)
Creatinine, Ser: 1.1 mg/dL (ref 0.40–1.50)
GFR: 82 mL/min (ref >60.00)
Glucose, Bld: 106 mg/dL — ABNORMAL HIGH (ref 70–99)
Potassium: 4.3 mEq/L (ref 3.5–5.1)
Sodium: 139 mEq/L (ref 135–145)
Total Bilirubin: 0.5 mg/dL (ref 0.2–1.2)
Total Protein: 7.5 g/dL (ref 6.0–8.3)

## 2020-09-17 LAB — LIPID PANEL
Cholesterol: 253 mg/dL — ABNORMAL HIGH (ref 0–200)
HDL: 38.6 mg/dL — ABNORMAL LOW (ref >39.00)
Total CHOL/HDL Ratio: 7
Triglycerides: 504 mg/dL — ABNORMAL HIGH (ref 0.0–149.0)

## 2020-09-17 LAB — URINALYSIS, ROUTINE W REFLEX MICROSCOPIC
Bilirubin Urine: NEGATIVE
Ketones, ur: NEGATIVE
Leukocytes,Ua: NEGATIVE
Nitrite: NEGATIVE
Specific Gravity, Urine: >=1.03 — AB (ref 1.000–1.030)
Total Protein, Urine: NEGATIVE
Urine Glucose: NEGATIVE
Urobilinogen, UA: 0.2 (ref 0.0–1.0)
pH: 6 (ref 5.0–8.0)

## 2020-09-17 LAB — CBC
HCT: 47.6 % (ref 39.0–52.0)
Hemoglobin: 16.1 g/dL (ref 13.0–17.0)
MCHC: 33.8 g/dL (ref 30.0–36.0)
MCV: 92.9 fl (ref 78.0–100.0)
Platelets: 190 10*3/uL (ref 150.0–400.0)
RBC: 5.13 Mil/uL (ref 4.22–5.81)
RDW: 13.3 % (ref 11.5–15.5)
WBC: 6.4 10*3/uL (ref 4.0–10.5)

## 2020-09-17 LAB — LDL CHOLESTEROL, DIRECT: Direct LDL: 94 mg/dL
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
## 2020-09-18 NOTE — Progress Notes (Signed)
Labs show elevated fat in the blood. Please lower fat and cholesterol in the diet and try to lose some weight. Follow up in 6 months.

## 2020-09-20 ENCOUNTER — Encounter: Payer: Self-pay | Admitting: Family Medicine

## 2020-09-22 NOTE — Telephone Encounter (Signed)
Spoke to patient and he was concerned about having mucus in his urine.   Advised him that Dr Doreene Burke wasn't worried about that and then went over other labs. No further questions. Dm/cma

## 2021-09-23 ENCOUNTER — Encounter: Payer: Medicaid Other | Admitting: Family Medicine

## 2021-09-29 ENCOUNTER — Encounter: Payer: 59 | Admitting: Family Medicine
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
## 2022-12-06 ENCOUNTER — Other Ambulatory Visit: Payer: Self-pay

## 2022-12-06 ENCOUNTER — Emergency Department (HOSPITAL_BASED_OUTPATIENT_CLINIC_OR_DEPARTMENT_OTHER)
Admission: EM | Admit: 2022-12-06 | Discharge: 2022-12-06 | Disposition: A | Discharge status: Home / Self Care | Payer: No Typology Code available for payment source | Attending: Emergency Medicine | Admitting: Emergency Medicine

## 2022-12-06 ENCOUNTER — Encounter (HOSPITAL_BASED_OUTPATIENT_CLINIC_OR_DEPARTMENT_OTHER): Payer: Self-pay | Admitting: Emergency Medicine

## 2022-12-06 ENCOUNTER — Emergency Department (HOSPITAL_BASED_OUTPATIENT_CLINIC_OR_DEPARTMENT_OTHER): Payer: No Typology Code available for payment source

## 2022-12-06 DIAGNOSIS — S93602A Unspecified sprain of left foot, initial encounter: Secondary | ICD-10-CM | POA: Insufficient documentation

## 2022-12-06 DIAGNOSIS — X501XXA Overexertion from prolonged static or awkward postures, initial encounter: Secondary | ICD-10-CM | POA: Insufficient documentation

## 2022-12-06 DIAGNOSIS — S99922A Unspecified injury of left foot, initial encounter: Secondary | ICD-10-CM | POA: Diagnosis present

## 2022-12-06 MED ORDER — HYDROCODONE-ACETAMINOPHEN 5-325 MG PO TABS
2.0000 | ORAL_TABLET | Freq: Once | ORAL | Status: AC
  Administered 2022-12-06: 2 via ORAL
  Filled 2022-12-06: qty 2

## 2022-12-06 NOTE — ED Provider Notes (Signed)
[facility] Provider Note   CSN: [id number] Arrival date & time: 12/06/22  1735     History  Chief Complaint  Patient presents with   Foot Pain    Vincent Glass is a 47 y.o. male.  Patient reports tripped on his steps and injured his left foot.  Patient complains of swelling and pain.  Patient reports difficulty ambulating due to injury.  Patient also states that he scraped the top of his head.  Patient did not lose consciousness he denies any headache denies any visual change no hearing change  The history is provided by the patient. No language interpreter was used.  Foot Pain This is a new problem. The problem has not changed since onset.Nothing aggravates the symptoms. Nothing relieves the symptoms. He has tried nothing for the symptoms.       Home Medications Prior to Admission medications   Medication Sig Start Date End Date Taking? Authorizing Provider  albuterol (VENTOLIN HFA) 108 (90 Base) MCG/ACT inhaler Inhale 2 puffs into the lungs every 6 (six) hours as needed for wheezing or shortness of breath.    [provider]  alprazolam Prudy Feeler) 2 MG tablet Take 1-2 mg by mouth See admin instructions. 2 mg daily, and 1 mg in the late evening    [provider]  Multiple Vitamins-Minerals (MULTIVITAMIN WITH MINERALS) tablet Take 1 tablet by mouth daily.    [provider]      Allergies    Percocet [oxycodone-acetaminophen]    Review of Systems   Review of Systems  All other systems reviewed and are negative.   Physical Exam Updated Vital Signs BP (!) 139/94 (BP Location: Left Arm)   Pulse 89   Temp 99.8 F (37.7 C) (Oral)   Resp 14   Ht 5\' 11"  (1.803 m)   Wt 97.5 kg   SpO2 93%   BMI 29.99 kg/m  Physical Exam Vitals and nursing note reviewed.  Constitutional:      Appearance: He is well-developed.  HENT:     Head: Normocephalic.  Cardiovascular:     Rate and Rhythm: Normal rate.   Pulmonary:     Effort: Pulmonary effort is normal.  Abdominal:     General: There is no distension.  Musculoskeletal:        General: Swelling and tenderness present. Normal range of motion.     Cervical back: Normal range of motion.     Comments: Swollen tender left mid foot pain with range of motion neurovascular neurosensory intact  Skin:    General: Skin is warm.  Neurological:     General: No focal deficit present.     Mental Status: He is alert and oriented to person, place, and time.     ED Results / Procedures / Treatments   Labs (all labs ordered are listed, but only abnormal results are displayed) Labs Reviewed - No data to display  EKG None  Radiology DG Foot Complete Left  Result Date: 12/06/2022 CLINICAL DATA:  Fall, left foot injury EXAM: LEFT FOOT - COMPLETE 3+ VIEW COMPARISON:  None Available. FINDINGS: There is no evidence of fracture or dislocation. There is no evidence of arthropathy or other focal bone abnormality. Soft tissues are unremarkable. IMPRESSION: Negative. Electronically Signed   By: Helyn Numbers M.D.   On: 12/06/2022 20:37    Procedures Procedures    Medications Ordered in ED Medications  HYDROcodone-acetaminophen (NORCO/VICODIN) 5-325 MG per tablet 2 tablet (2 tablets  Oral Given 12/06/22 2010)    ED Course/ Medical Decision Making/ A&P                                 Medical Decision Making Reports he tripped and fell down steps at his house injuring his left foot  Amount and/or Complexity of Data Reviewed Radiology: ordered and independent interpretation performed. Decision-making details documented in ED Course.    Details: Spray left foot shows no evidence of fracture  Risk Prescription drug management. Risk Details: Patient placed in a Ace wrap and postop shoe.  Patient is is advised to follow-up with orthopedist for recheck if pain persist past 1 week.           Final Clinical Impression(s) / ED Diagnoses Final  diagnoses:  Sprain of left foot, initial encounter    Rx / DC Orders ED Discharge Orders     None      An After Visit Summary was printed and given to the patient.    Elson Areas, PA-C 12/06/22 2358    Ernie Avena, MD 12/07/22 [phone]

## 2022-12-06 NOTE — Discharge Instructions (Signed)
Follow up with Orthopaedist for recheck next week

## 2022-12-06 NOTE — ED Notes (Signed)
X-ray at bedside

## 2022-12-06 NOTE — ED Triage Notes (Signed)
Pt reports tripping on the steps and twisting his left foot on Saturday, left side of foot is red and edematous, can bear weight but it is painful
